# Patient Record
Sex: Female | Born: 1980 | Hispanic: No | Marital: Single | Smoking: Never smoker
Health system: Southern US, Community
[De-identification: ages and names within clinical notes are randomized; demographics above are authoritative.]

## PROBLEM LIST (undated history)

## (undated) ENCOUNTER — Inpatient Hospital Stay (HOSPITAL_COMMUNITY): Payer: Self-pay

## (undated) DIAGNOSIS — Z789 Other specified health status: Secondary | ICD-10-CM

## (undated) HISTORY — PX: NO PAST SURGERIES: SHX2092

---

## 2016-06-10 LAB — OB RESULTS CONSOLE ABO/RH: RH TYPE: POSITIVE

## 2016-06-10 LAB — OB RESULTS CONSOLE GC/CHLAMYDIA
Chlamydia: NEGATIVE
GC PROBE AMP, GENITAL: NEGATIVE

## 2016-06-10 LAB — OB RESULTS CONSOLE ANTIBODY SCREEN: ANTIBODY SCREEN: NEGATIVE

## 2016-06-10 LAB — OB RESULTS CONSOLE RUBELLA ANTIBODY, IGM: Rubella: IMMUNE

## 2016-06-10 LAB — OB RESULTS CONSOLE HEPATITIS B SURFACE ANTIGEN: Hepatitis B Surface Ag: NEGATIVE

## 2016-06-10 LAB — OB RESULTS CONSOLE HIV ANTIBODY (ROUTINE TESTING): HIV: NONREACTIVE

## 2016-06-10 LAB — OB RESULTS CONSOLE RPR: RPR: NONREACTIVE

## 2016-07-04 ENCOUNTER — Encounter (HOSPITAL_COMMUNITY): Payer: Self-pay

## 2016-07-04 ENCOUNTER — Inpatient Hospital Stay (HOSPITAL_COMMUNITY)
Admission: AD | Admit: 2016-07-04 | Discharge: 2016-07-04 | Disposition: A | Discharge status: Home / Self Care | Payer: 59 | Source: Ambulatory Visit | Attending: Obstetrics and Gynecology | Admitting: Obstetrics and Gynecology

## 2016-07-04 ENCOUNTER — Inpatient Hospital Stay (HOSPITAL_COMMUNITY): Payer: 59

## 2016-07-04 DIAGNOSIS — O4690 Antepartum hemorrhage, unspecified, unspecified trimester: Secondary | ICD-10-CM

## 2016-07-04 DIAGNOSIS — O418X20 Other specified disorders of amniotic fluid and membranes, second trimester, not applicable or unspecified: Secondary | ICD-10-CM

## 2016-07-04 DIAGNOSIS — O26851 Spotting complicating pregnancy, first trimester: Secondary | ICD-10-CM | POA: Insufficient documentation

## 2016-07-04 DIAGNOSIS — Z3A13 13 weeks gestation of pregnancy: Secondary | ICD-10-CM | POA: Insufficient documentation

## 2016-07-04 DIAGNOSIS — O26891 Other specified pregnancy related conditions, first trimester: Secondary | ICD-10-CM | POA: Diagnosis not present

## 2016-07-04 DIAGNOSIS — O468X2 Other antepartum hemorrhage, second trimester: Secondary | ICD-10-CM

## 2016-07-04 DIAGNOSIS — Z6790 Unspecified blood type, Rh positive: Secondary | ICD-10-CM

## 2016-07-04 HISTORY — DX: Other specified health status: Z78.9

## 2016-07-04 LAB — CBC
HCT: 35 % — ABNORMAL LOW (ref 36.0–46.0)
Hemoglobin: 12.2 g/dL (ref 12.0–15.0)
MCH: 31.6 pg (ref 26.0–34.0)
MCHC: 34.9 g/dL (ref 30.0–36.0)
MCV: 90.7 fL (ref 78.0–100.0)
PLATELETS: 288 10*3/uL (ref 150–400)
RBC: 3.86 MIL/uL — AB (ref 3.87–5.11)
RDW: 13.1 % (ref 11.5–15.5)
WBC: 8.4 10*3/uL (ref 4.0–10.5)

## 2016-07-04 LAB — URINALYSIS, ROUTINE W REFLEX MICROSCOPIC
Bilirubin Urine: NEGATIVE
Glucose, UA: NEGATIVE mg/dL
KETONES UR: NEGATIVE mg/dL
LEUKOCYTES UA: NEGATIVE
NITRITE: NEGATIVE
PH: 6 (ref 5.0–8.0)
Protein, ur: NEGATIVE mg/dL
Specific Gravity, Urine: 1.01 (ref 1.005–1.030)

## 2016-07-04 LAB — URINE MICROSCOPIC-ADD ON

## 2016-07-04 LAB — ABO/RH: ABO/RH(D): O POS

## 2016-07-04 NOTE — MAU Provider Note (Signed)
History     CSN: [id number]  Arrival date and time: 07/04/16 [id number]   First Provider Initiated Contact with Patient 07/04/16 0401      Chief Complaint  Patient presents with  . Vaginal Bleeding   G2P1001 @13 .3 weeks here with VB. She reports pink and brown spotting since yesterday then became bright red about 3 hrs ago. She saw the blood on the toilet paper and some in toilet. She had some lower abdominal cramping yesterday which subsided but is having lower abdominal pressure. She denies any recent IC. No traumatic injury or falls.   OB History    Gravida Para Term Preterm AB Living   2 1 1     1    SAB TAB Ectopic Multiple Live Births           1      Past Medical History:  Diagnosis Date  . Medical history non-contributory     Past Surgical History:  Procedure Laterality Date  . NO PAST SURGERIES      Family History  Problem Relation Age of Onset  . Hypertension Father   . Hyperlipidemia Father     Social History  Substance Use Topics  . Smoking status: Never Smoker  . Smokeless tobacco: Never Used  . Alcohol use No    Allergies: No Known Allergies  Prescriptions Prior to Admission  Medication Sig Dispense Refill Last Dose  . folic acid (FOLVITE) 400 MCG tablet Take 400 mcg by mouth daily.   07/03/2016 at Unknown time  . Prenatal Vit-Fe Fumarate-FA (PRENATAL MULTIVITAMIN) TABS tablet Take 1 tablet by mouth daily at 12 noon.   07/03/2016 at Unknown time    Review of Systems  Constitutional: Negative.   Gastrointestinal: Negative.    Physical Exam   Blood pressure 122/80, pulse 79, temperature 98.2 F (36.8 C), temperature source Oral, resp. rate 16, weight 70.9 kg (156 lb 6 oz), last menstrual period 04/02/2016, SpO2 100 %.  Physical Exam  Constitutional: She is oriented to person, place, and time. She appears well-developed and well-nourished. No distress.  HENT:  Head: Normocephalic and atraumatic.  Neck: Normal range of motion.  Cardiovascular:  Normal rate.   Respiratory: Effort normal.  GI: Soft. She exhibits no distension and no mass. There is no tenderness. There is no rebound and no guarding.  Genitourinary:  Genitourinary Comments: External: no lesions Vagina: rugated, parous, scant brown discharge SVE: closed/long   Musculoskeletal: Normal range of motion.  Neurological: She is alert and oriented to person, place, and time.  Skin: Skin is warm and dry.  Psychiatric: She has a normal mood and affect.   FHT: 170 bpm  Results for orders placed or performed during the hospital encounter of 07/04/16 (from the past 24 hour(s))  Urinalysis, Routine w reflex microscopic ([facility])     Status: Abnormal   Collection Time: 07/04/16  3:20 AM  Result Value Ref Range   Color, Urine YELLOW YELLOW   APPearance CLEAR CLEAR   Specific Gravity, Urine 1.010 1.005 - 1.030   pH 6.0 5.0 - 8.0   Glucose, UA NEGATIVE NEGATIVE mg/dL   Hgb urine dipstick TRACE (A) NEGATIVE   Bilirubin Urine NEGATIVE NEGATIVE   Ketones, ur NEGATIVE NEGATIVE mg/dL   Protein, ur NEGATIVE NEGATIVE mg/dL   Nitrite NEGATIVE NEGATIVE   Leukocytes, UA NEGATIVE NEGATIVE  Urine microscopic-add on     Status: Abnormal   Collection Time: 07/04/16  3:20 AM  Result Value Ref Range  Squamous Epithelial / LPF 0-5 (A) NONE SEEN   WBC, UA 0-5 0 - 5 WBC/hpf   RBC / HPF 0-5 0 - 5 RBC/hpf   Bacteria, UA RARE (A) NONE SEEN  ABO/Rh     Status: None   Collection Time: 07/04/16  4:08 AM  Result Value Ref Range   ABO/RH(D) O POS   CBC     Status: Abnormal   Collection Time: 07/04/16  4:08 AM  Result Value Ref Range   WBC 8.4 4.0 - 10.5 K/uL   RBC 3.86 (L) 3.87 - 5.11 MIL/uL   Hemoglobin 12.2 12.0 - 15.0 g/dL   HCT 16.135.0 (L) 09.636.0 - 04.546.0 %   MCV 90.7 78.0 - 100.0 fL   MCH 31.6 26.0 - 34.0 pg   MCHC 34.9 30.0 - 36.0 g/dL   RDW 40.913.1 81.111.5 - 91.415.5 %   Platelets 288 150 - 400 K/uL   [place] Ob Comp Less 14 Wks  Result Date: 07/04/2016 CLINICAL DATA:  Spotting and cramping  today EXAM: OBSTETRIC <14 WK ULTRASOUND TECHNIQUE: Transabdominal ultrasound was performed for evaluation of the gestation as well as the maternal uterus and adnexal regions. COMPARISON:  None. FINDINGS: Intrauterine gestational sac: Single Yolk sac:  Not visible Embryo:  Visible Cardiac Activity: Visible Heart Rate: 177 bpm MSD:   mm    w     d CRL:   68.6  mm   13 w 1 d                  US EDC: 01/08/2017 Subchorionic hemorrhage: Subchorionic hemorrhage, measuring 1.2 x 2.6 x 4.0 cm. Maternal uterus/adnexae: Neither ovary is visible. No abnormal pelvic fluid collections. IMPRESSION: Single living intrauterine gestation measuring 13 weeks 1 day by crown-rump length. Subchorionic hemorrhage noted. Electronically Signed   By: Ellery Plunkaniel R Mitchell M.D.   On: 07/04/2016 04:45    MAU Course  Procedures  MDM Labs and US ordered and reviewed. [facility]. Discussed presentation, clinical findings, and plan with Dr. Billy Coastaavon. Stable for discharge home.  Assessment and Plan   1. Subchorionic hematoma in second trimester, single or unspecified fetus   2. Vaginal bleeding in pregnancy   3.      Rh positive  Discharge home Follow up in office as scheduled Bleeding precautions Continue PNV 1 po daily   Donette LarryMelanie Josten [place], CNM 07/04/2016, 4:09 AM

## 2016-07-04 NOTE — Discharge Instructions (Signed)
Subchorionic Hematoma °A subchorionic hematoma is a gathering of blood between the outer wall of the placenta and the inner wall of the womb (uterus). The placenta is the organ that connects the fetus to the wall of the uterus. The placenta performs the feeding, breathing (oxygen to the fetus), and waste removal (excretory work) of the fetus.  °Subchorionic hematoma is the most common abnormality found on a result from ultrasonography done during the first trimester or early second trimester of pregnancy. If there has been little or no vaginal bleeding, early small hematomas usually shrink on their own and do not affect your baby or pregnancy. The blood is gradually absorbed over 1-2 weeks. When bleeding starts later in pregnancy or the hematoma is larger or occurs in an older pregnant woman, the outcome may not be as good. Larger hematomas may get bigger, which increases the chances for miscarriage. Subchorionic hematoma also increases the risk of premature detachment of the placenta from the uterus, preterm (premature) labor, and stillbirth. °HOME CARE INSTRUCTIONS °· Stay on bed rest if your health care provider recommends this. Although bed rest will not prevent more bleeding or prevent a miscarriage, your health care provider may recommend bed rest until you are advised otherwise. °· Avoid heavy lifting (more than 10 lb [4.5 kg]), exercise, sexual intercourse, or douching as directed by your health care provider. °· Keep track of the number of pads you use each day and how soaked (saturated) they are. Write down this information. °· Do not use tampons. °· Keep all follow-up appointments as directed by your health care provider. Your health care provider may ask you to have follow-up blood tests or ultrasound tests or both. °SEEK IMMEDIATE MEDICAL CARE IF: °· You have severe cramps in your stomach, back, abdomen, or pelvis. °· You have a fever. °· You pass large clots or tissue. Save any tissue for your health  care provider to look at. °· Your bleeding increases or you become lightheaded, feel weak, or have fainting episodes. °This information is not intended to replace advice given to you by your health care provider. Make sure you discuss any questions you have with your health care provider. °Document Released: 11/18/2006 Document Revised: 08/24/2014 Document Reviewed: 03/02/2013 °Elsevier Interactive Patient Education © 2017 [employer] Inc[employer] ° °

## 2016-07-04 NOTE — MAU Note (Signed)
Brown spotting to pink spotting on Friday at 2:30 pm.  At 1:30 am, bright red when wiped. Pressure at low abd now, was cramping yesterday but not now.

## 2016-08-17 NOTE — L&D Delivery Note (Addendum)
Delivery Note  First Stage: Labor onset: 5/29 @ 0415 with SROM Augmentation : none Analgesia /Anesthesia intrapartum: none SROM at 0410 am for light meconium/yellow fluid   Second Stage: Complete dilation at 0740 Onset of pushing at 0730 am FHR second stage: intermittent monitoring FHR 130s before, during, and after contraction  In the birthing tub at 0721am Water temp: 101.1 degrees F, but cooled down to 100.30F Total time in the tub: 30 minutes  Delivery of a viable female at 70751am by Carlean JewsMeredith Sigmon, CNM in LOA position Double nuchal cord - first loop slipped down over the body, and then somersaulted the baby to relieve the second tight nuchal cord - T. Bailey to assist  Cord double clamped after cessation of pulsation, cut by FOB Cord blood sample collected   Third Stage: Placenta delivered via Tomasa BlaseSchultz intact with 3 VC @ 0800 Placenta disposition: hospital disposal  Uterine tone firm with massage and IV Pitocin 500mL bolus infusing / bleeding moderate  Bilateral sulcus laceration identified - repaired by Wiliam Ke. Bailey, CNM 1st degree perineal: repaired by M. Sigmon, CNM  Anesthesia for repair: 1% Lidocaine 30mL Repair: 2.0 vicryl and 3.0 vicryl Est. Blood Loss (mL): 300mL  Complications: none  Mom to postpartum.  Baby to Couplet care / Skin to Skin.  Newborn: Birth Weight: 7#11.3oz (3496grams) Apgar Scores: 9, 9 Feeding planned: breast  Carlean JewsMeredith Sigmon, MSN, CNM [facility]

## 2016-12-04 LAB — OB RESULTS CONSOLE GBS: STREP GROUP B AG: NEGATIVE

## 2017-01-12 ENCOUNTER — Inpatient Hospital Stay (HOSPITAL_COMMUNITY): Payer: 59 | Admitting: Anesthesiology

## 2017-01-12 ENCOUNTER — Inpatient Hospital Stay (HOSPITAL_COMMUNITY)
Admission: AD | Admit: 2017-01-12 | Discharge: 2017-01-14 | DRG: 767 | Disposition: A | Discharge status: Home / Self Care | Payer: 59 | Source: Ambulatory Visit | Attending: Obstetrics and Gynecology | Admitting: Obstetrics and Gynecology

## 2017-01-12 ENCOUNTER — Encounter (HOSPITAL_COMMUNITY): Payer: Self-pay

## 2017-01-12 ENCOUNTER — Encounter (HOSPITAL_COMMUNITY)
Admission: AD | Disposition: A | Discharge status: Home / Self Care | Payer: Self-pay | Source: Ambulatory Visit | Attending: Obstetrics and Gynecology

## 2017-01-12 DIAGNOSIS — Z3A40 40 weeks gestation of pregnancy: Secondary | ICD-10-CM | POA: Diagnosis not present

## 2017-01-12 DIAGNOSIS — Z3493 Encounter for supervision of normal pregnancy, unspecified, third trimester: Secondary | ICD-10-CM | POA: Diagnosis present

## 2017-01-12 DIAGNOSIS — Z302 Encounter for sterilization: Secondary | ICD-10-CM

## 2017-01-12 HISTORY — PX: TUBAL LIGATION: SHX77

## 2017-01-12 LAB — CBC
HEMATOCRIT: 39.1 % (ref 36.0–46.0)
HEMOGLOBIN: 13.3 g/dL (ref 12.0–15.0)
MCH: 31.3 pg (ref 26.0–34.0)
MCHC: 34 g/dL (ref 30.0–36.0)
MCV: 92 fL (ref 78.0–100.0)
Platelets: 247 10*3/uL (ref 150–400)
RBC: 4.25 MIL/uL (ref 3.87–5.11)
RDW: 14 % (ref 11.5–15.5)
WBC: 9.4 10*3/uL (ref 4.0–10.5)

## 2017-01-12 LAB — TYPE AND SCREEN
ABO/RH(D): O POS
ANTIBODY SCREEN: NEGATIVE

## 2017-01-12 LAB — RPR: RPR: NONREACTIVE

## 2017-01-12 SURGERY — LIGATION, FALLOPIAN TUBE, POSTPARTUM
Anesthesia: General

## 2017-01-12 MED ORDER — DIPHENHYDRAMINE HCL 25 MG PO CAPS: 25.0000 mg | ORAL_CAPSULE | Freq: Four times a day (QID) | ORAL | Status: DC | PRN

## 2017-01-12 MED ORDER — OXYCODONE HCL 5 MG/5ML PO SOLN: 5.0000 mg | Freq: Once | ORAL | Status: AC | PRN

## 2017-01-12 MED ORDER — LACTATED RINGERS IV SOLN
INTRAVENOUS | Status: DC
  Administered 2017-01-12: 16:00:00 via INTRAVENOUS
  Administered 2017-01-12: 20 mL/h via INTRAVENOUS

## 2017-01-12 MED ORDER — OXYCODONE HCL 5 MG PO TABS
ORAL_TABLET | ORAL | Status: AC
  Filled 2017-01-12: qty 1

## 2017-01-12 MED ORDER — PROMETHAZINE HCL 25 MG/ML IJ SOLN: 6.2500 mg | INTRAMUSCULAR | Status: DC | PRN

## 2017-01-12 MED ORDER — WITCH HAZEL-GLYCERIN EX PADS: 1.0000 "application " | MEDICATED_PAD | CUTANEOUS | Status: DC | PRN

## 2017-01-12 MED ORDER — OXYTOCIN 40 UNITS IN LACTATED RINGERS INFUSION - SIMPLE MED
2.5000 [IU]/h | INTRAVENOUS | Status: DC
  Filled 2017-01-12: qty 1000

## 2017-01-12 MED ORDER — LIDOCAINE HCL (PF) 1 % IJ SOLN
30.0000 mL | INTRAMUSCULAR | Status: AC | PRN
  Administered 2017-01-12: 30 mL via SUBCUTANEOUS
  Filled 2017-01-12: qty 30

## 2017-01-12 MED ORDER — OXYCODONE HCL 5 MG PO TABS
5.0000 mg | ORAL_TABLET | Freq: Once | ORAL | Status: AC | PRN
  Administered 2017-01-12: 5 mg via ORAL

## 2017-01-12 MED ORDER — OXYCODONE HCL 5 MG PO TABS: 10.0000 mg | ORAL_TABLET | ORAL | Status: DC | PRN

## 2017-01-12 MED ORDER — ONDANSETRON HCL 4 MG/2ML IJ SOLN
INTRAMUSCULAR | Status: DC | PRN
  Administered 2017-01-12: 4 mg via INTRAVENOUS

## 2017-01-12 MED ORDER — SENNOSIDES-DOCUSATE SODIUM 8.6-50 MG PO TABS
2.0000 | ORAL_TABLET | ORAL | Status: DC
  Administered 2017-01-12 – 2017-01-14 (×2): 2 via ORAL
  Filled 2017-01-12 (×2): qty 2

## 2017-01-12 MED ORDER — ONDANSETRON HCL 4 MG/2ML IJ SOLN: 4.0000 mg | INTRAMUSCULAR | Status: DC | PRN

## 2017-01-12 MED ORDER — FAMOTIDINE 20 MG PO TABS
40.0000 mg | ORAL_TABLET | Freq: Once | ORAL | Status: AC
  Administered 2017-01-12: 40 mg via ORAL
  Filled 2017-01-12: qty 2

## 2017-01-12 MED ORDER — IBUPROFEN 600 MG PO TABS
600.0000 mg | ORAL_TABLET | Freq: Four times a day (QID) | ORAL | Status: DC
  Administered 2017-01-12 – 2017-01-14 (×8): 600 mg via ORAL
  Filled 2017-01-12 (×9): qty 1

## 2017-01-12 MED ORDER — FENTANYL CITRATE (PF) 100 MCG/2ML IJ SOLN: 25.0000 ug | INTRAMUSCULAR | Status: DC | PRN

## 2017-01-12 MED ORDER — SIMETHICONE 80 MG PO CHEW: 80.0000 mg | CHEWABLE_TABLET | ORAL | Status: DC | PRN

## 2017-01-12 MED ORDER — OXYTOCIN BOLUS FROM INFUSION
500.0000 mL | Freq: Once | INTRAVENOUS | Status: AC
  Administered 2017-01-12: 500 mL via INTRAVENOUS

## 2017-01-12 MED ORDER — LACTATED RINGERS IV SOLN: INTRAVENOUS | Status: DC

## 2017-01-12 MED ORDER — ACETAMINOPHEN 325 MG PO TABS: 650.0000 mg | ORAL_TABLET | ORAL | Status: DC | PRN

## 2017-01-12 MED ORDER — BUPIVACAINE HCL (PF) 0.25 % IJ SOLN
INTRAMUSCULAR | Status: DC | PRN
  Administered 2017-01-12: 30 mL

## 2017-01-12 MED ORDER — DIBUCAINE 1 % RE OINT: 1.0000 "application " | TOPICAL_OINTMENT | RECTAL | Status: DC | PRN

## 2017-01-12 MED ORDER — ONDANSETRON HCL 4 MG/2ML IJ SOLN: 4.0000 mg | Freq: Four times a day (QID) | INTRAMUSCULAR | Status: DC | PRN

## 2017-01-12 MED ORDER — METOCLOPRAMIDE HCL 10 MG PO TABS
10.0000 mg | ORAL_TABLET | Freq: Once | ORAL | Status: AC
  Administered 2017-01-12: 10 mg via ORAL
  Filled 2017-01-12: qty 1

## 2017-01-12 MED ORDER — LACTATED RINGERS IV SOLN: 500.0000 mL | INTRAVENOUS | Status: DC | PRN

## 2017-01-12 MED ORDER — COCONUT OIL OIL
1.0000 "application " | TOPICAL_OIL | Status: DC | PRN
  Administered 2017-01-13: 1 via TOPICAL
  Filled 2017-01-12: qty 120

## 2017-01-12 MED ORDER — PHENYLEPHRINE HCL 10 MG/ML IJ SOLN
INTRAMUSCULAR | Status: DC | PRN
  Administered 2017-01-12: 80 ug via INTRAVENOUS
  Administered 2017-01-12 (×2): 40 ug via INTRAVENOUS

## 2017-01-12 MED ORDER — OXYCODONE HCL 5 MG PO TABS
5.0000 mg | ORAL_TABLET | ORAL | Status: DC | PRN
Start: 2017-01-12 — End: 2017-01-14
  Administered 2017-01-12 – 2017-01-13 (×4): 5 mg via ORAL
  Filled 2017-01-12 (×4): qty 1

## 2017-01-12 MED ORDER — ONDANSETRON HCL 4 MG PO TABS: 4.0000 mg | ORAL_TABLET | ORAL | Status: DC | PRN

## 2017-01-12 MED ORDER — KETOROLAC TROMETHAMINE 30 MG/ML IJ SOLN
INTRAMUSCULAR | Status: AC
  Filled 2017-01-12: qty 1

## 2017-01-12 MED ORDER — ZOLPIDEM TARTRATE 5 MG PO TABS: 5.0000 mg | ORAL_TABLET | Freq: Every evening | ORAL | Status: DC | PRN

## 2017-01-12 MED ORDER — ACETAMINOPHEN 325 MG PO TABS
650.0000 mg | ORAL_TABLET | ORAL | Status: DC | PRN
  Administered 2017-01-12 – 2017-01-13 (×2): 650 mg via ORAL
  Filled 2017-01-12 (×2): qty 2

## 2017-01-12 MED ORDER — KETOROLAC TROMETHAMINE 30 MG/ML IJ SOLN
30.0000 mg | Freq: Once | INTRAMUSCULAR | Status: DC | PRN
  Administered 2017-01-12: 30 mg via INTRAVENOUS

## 2017-01-12 MED ORDER — SOD CITRATE-CITRIC ACID 500-334 MG/5ML PO SOLN: 30.0000 mL | ORAL | Status: DC | PRN

## 2017-01-12 MED ORDER — MEPERIDINE HCL 25 MG/ML IJ SOLN: 6.2500 mg | INTRAMUSCULAR | Status: DC | PRN

## 2017-01-12 MED ORDER — MIDAZOLAM HCL 2 MG/2ML IJ SOLN
INTRAMUSCULAR | Status: DC | PRN
  Administered 2017-01-12: 2 mg via INTRAVENOUS

## 2017-01-12 MED ORDER — FENTANYL CITRATE (PF) 100 MCG/2ML IJ SOLN
INTRAMUSCULAR | Status: DC | PRN
  Administered 2017-01-12: 50 ug via INTRAVENOUS

## 2017-01-12 MED ORDER — MEPERIDINE HCL 25 MG/ML IJ SOLN
INTRAMUSCULAR | Status: DC | PRN
Start: 2017-01-12 — End: 2017-01-12
  Administered 2017-01-12: 12.5 mg via INTRAVENOUS

## 2017-01-12 MED ORDER — BENZOCAINE-MENTHOL 20-0.5 % EX AERO
1.0000 "application " | INHALATION_SPRAY | CUTANEOUS | Status: DC | PRN
  Administered 2017-01-12: 1 via TOPICAL
  Filled 2017-01-12: qty 56

## 2017-01-12 MED ORDER — PRENATAL MULTIVITAMIN CH
1.0000 | ORAL_TABLET | Freq: Every day | ORAL | Status: DC
  Administered 2017-01-13 – 2017-01-14 (×2): 1 via ORAL
  Filled 2017-01-12 (×2): qty 1

## 2017-01-12 SURGICAL SUPPLY — 29 items
CLOTH BEACON ORANGE TIMEOUT ST (SAFETY) ×2 IMPLANT
CONTAINER PREFILL 10% NBF 15ML (MISCELLANEOUS) ×4 IMPLANT
DECANTER SPIKE VIAL GLASS SM (MISCELLANEOUS) ×2 IMPLANT
DERMABOND ADVANCED (GAUZE/BANDAGES/DRESSINGS) ×1
DERMABOND ADVANCED .7 DNX12 (GAUZE/BANDAGES/DRESSINGS) ×1 IMPLANT
DRESSING OPSITE X SMALL 2X3 (GAUZE/BANDAGES/DRESSINGS) ×2 IMPLANT
DRSG OPSITE POSTOP 3X4 (GAUZE/BANDAGES/DRESSINGS) ×2 IMPLANT
DURAPREP 26ML APPLICATOR (WOUND CARE) ×2 IMPLANT
ELECT REM PT RETURN 9FT ADLT (ELECTROSURGICAL) ×2
ELECTRODE REM PT RTRN 9FT ADLT (ELECTROSURGICAL) ×1 IMPLANT
GLOVE BIO SURGEON STRL SZ7 (GLOVE) ×2 IMPLANT
GLOVE BIOGEL PI IND STRL 7.0 (GLOVE) ×2 IMPLANT
GLOVE BIOGEL PI INDICATOR 7.0 (GLOVE) ×2
GOWN STRL REUS W/TWL LRG LVL3 (GOWN DISPOSABLE) ×4 IMPLANT
NEEDLE HYPO 22GX1.5 SAFETY (NEEDLE) ×2 IMPLANT
NS IRRIG 1000ML POUR BTL (IV SOLUTION) ×2 IMPLANT
PACK ABDOMINAL MINOR (CUSTOM PROCEDURE TRAY) ×2 IMPLANT
PENCIL BUTTON HOLSTER BLD 10FT (ELECTRODE) ×2 IMPLANT
PROTECTOR NERVE ULNAR (MISCELLANEOUS) ×2 IMPLANT
SPONGE LAP 4X18 X RAY DECT (DISPOSABLE) IMPLANT
STRIP CLOSURE SKIN 1/4X3 (GAUZE/BANDAGES/DRESSINGS) ×2 IMPLANT
SUT PLAIN 0 NONE (SUTURE) ×2 IMPLANT
SUT VIC AB 0 CT1 27 (SUTURE) ×1
SUT VIC AB 0 CT1 27XBRD ANBCTR (SUTURE) ×1 IMPLANT
SUT VICRYL 4-0 PS2 18IN ABS (SUTURE) ×2 IMPLANT
SYR CONTROL 10ML LL (SYRINGE) ×2 IMPLANT
TOWEL OR 17X24 6PK STRL BLUE (TOWEL DISPOSABLE) ×4 IMPLANT
TRAY FOLEY CATH SILVER 14FR (SET/KITS/TRAYS/PACK) ×2 IMPLANT
WATER STERILE IRR 1000ML POUR (IV SOLUTION) ×2 IMPLANT

## 2017-01-12 NOTE — Anesthesia Postprocedure Evaluation (Signed)
Anesthesia Post Note  Patient: Christina Leon  Procedure(s) Performed: Procedure(s) (LRB): POST PARTUM TUBAL LIGATION (N/A)  Patient location during evaluation: PACU Anesthesia Type: General Level of consciousness: sedated Pain management: pain level controlled Vital Signs Assessment: post-procedure vital signs reviewed and stable Respiratory status: spontaneous breathing and respiratory function stable Cardiovascular status: stable Anesthetic complications: no        Last Vitals:  Vitals:   01/12/17 1815 01/12/17 1830  BP: 126/85 113/73  Pulse: 78 73  Resp: 19 14  Temp:      Last Pain:  Vitals:   01/12/17 1800  TempSrc:   PainSc: 2    Pain Goal: Patients Stated Pain Goal: 3 (01/12/17 1715)               Halyn Flaugher DANIEL

## 2017-01-12 NOTE — Progress Notes (Signed)
S:  PT. In tub with water temp 101F - water cooled to 100.8      In hands/knees position      Involuntary pushing   O:  VS: Blood pressure 123/65, pulse 84, temperature 97.4 F (36.3 C), temperature source Oral, resp. rate 18, height 5\' 7"  (1.702 m), weight 81.2 kg (179 lb), last menstrual period 04/02/2016, unknown if currently breastfeeding.        FHR : Doppler: 150s        Toco: contractions every 1-643minutes / strong         Cervix : 9.5cm/100/+1 vtx        Membranes:   A: Active labor  P: Okay to enter tub     Thressa ShellerHeather Hogan, CNM with [facility] on standby until Wiliam Ke. Bailey, CNM arrives     Anticipate NSVD soon  Carlean JewsMeredith Sigmon, CNM

## 2017-01-12 NOTE — Progress Notes (Signed)
S:  Pt. In shower on hands/knees while birthing tub is filling       Breathing well through contractions, sounding transitional   O:  VS: Blood pressure 123/65, pulse 84, temperature 97.4 F (36.3 C), temperature source Oral, resp. rate 18, height 5\' 7"  (1.702 m), weight 81.2 kg (179 lb), last menstrual period 04/02/2016, unknown if currently breastfeeding.        FHR : baseline 150 bpm / variability moderate / accelerations + / no decelerations        Toco: contractions every 1-3 minutes / strong /        Cervix : deferred        Membranes: SROM- bloody show present   A: Active labor     FHR category: NST was Category 1     Intermittent monitoring  P: May enter tub once filled and water temperature stable      Ok for intermittent monitoring      Wiliam Ke. Bailey, CNM in route for delivery  Christina Leon, CNM

## 2017-01-12 NOTE — Progress Notes (Signed)
Patient ID: Christina Leon, female   DOB: [birth year], 36 y.o.   MRN: [id number] 36 yo G2P2, NSVD this morning. Desires permanent sterilization. Healthy female, uncomplicated pregnancy, no Med/Surg Hx. NKDAs.  PE-VS wnl, exam wnl. No postpartum concerns.   Risks/complications of surgery reviewed incl infection, bleeding, damage to internal organs including bladder, bowels, ureters, blood vessels, other risks from anesthesia, VTE and delayed complications of any surgery, complications in future surgery reviewed. Also reviewed risk of failure/ ectopic pregnancy/ risk of regret. Pt understands and agrees.   Hilary Hertz-V.Marshelle Bilger, MD

## 2017-01-12 NOTE — Anesthesia Procedure Notes (Signed)
Spinal

## 2017-01-12 NOTE — Transfer of Care (Signed)
Immediate Anesthesia Transfer of Care Note  Patient: Christina Leon  Procedure(s) Performed: Procedure(s): POST PARTUM TUBAL LIGATION (N/A)  Patient Location: PACU  Anesthesia Type:Spinal  Level of Consciousness: awake, alert  and oriented  Airway & Oxygen Therapy: Patient Spontanous Breathing  Post-op Assessment: Report given to RN and Post -op Vital signs reviewed and stable  Post vital signs: Reviewed and stable  Last Vitals:  Vitals:   01/12/17 1144 01/12/17 1448  BP: 125/72 134/76  Pulse: 84 86  Resp: 18 20  Temp: 37.1 C 37.6 C    Last Pain:  Vitals:   01/12/17 1448  TempSrc: Axillary  PainSc:       Patients Stated Pain Goal: 3 (01/12/17 1430)  Complications: No apparent anesthesia complications

## 2017-01-12 NOTE — MAU Note (Signed)
contractions 

## 2017-01-12 NOTE — H&P (Signed)
  OB ADMISSION/ HISTORY & PHYSICAL:  Admission Date: 01/12/2017  5:57 AM  Admit Diagnosis: Active labor at term   Christina Leon is a 36 y.o. female G2P1 at 40+6 weeks presenting for SROM at 0415, yellow fluid, and strong, regular uterine contractions.   Prenatal History: G2P1001   EDC : 01/06/2017, Date entered prior to episode creation  [facility] 1st [facility] Primary Ob Provider: Marlinda Mikeanya Bailey, CNM  Prenatal course complicated by: - Hx. Of forceps delivery at 37 weeks (delivery was in [place] and pt. Unsure of reason)  Prenatal Labs: ABO, Rh: --/--/O POS (11/18 0408) Antibody:  Negative  Rubella:   Immune  RPR:   NR HBsAg:   Negative HIV:   NR GTT: 120 GBS:   Negative  Medical / Surgical History :  Past medical history:  Past Medical History:  Diagnosis Date  . Medical history non-contributory      Past surgical history:  Past Surgical History:  Procedure Laterality Date  . NO PAST SURGERIES      Family History:  Family History  Problem Relation Age of Onset  . Hypertension Father   . Hyperlipidemia Father      Social History:  reports that she has never smoked. She has never used smokeless tobacco. She reports that she does not drink alcohol or use drugs.   Allergies: Patient has no known allergies.    Current Medications at time of admission:  Prior to Admission medications   Medication Sig Start Date End Date Taking? Authorizing Provider  folic acid (FOLVITE) 400 MCG tablet Take 400 mcg by mouth daily.    [provider]  Prenatal Vit-Fe Fumarate-FA (PRENATAL MULTIVITAMIN) TABS tablet Take 1 tablet by mouth daily at 12 noon.    [provider]    Review of Systems: Active FM onset of ctx @ [phone] currently every 1.5-.3 minutes LOF  / SROM @ 0415- yellow/light meconium stained fluid  bloody show not present  Physical Exam:  VS: Last menstrual period 04/02/2016.  General: alert and oriented,  breathing through ctxs well  Heart: RRR Lungs: Clear lung fields Abdomen: Gravid, soft and non-tender, non-distended / uterus:  Extremities: no  edema  Genitalia / VE: Dilation: 5.5 Effacement (%): 90 Station: 0 Exam by:: Meredith Sigmon CNM  FHR: baseline rate 150  / variability moderate / accelerations + / no decelerations TOCO: every 1.5-2 minutes/strong   Assessment: 40+[redacted] weeks gestation Active stage of labor FHR category 1 GBS Negative   Plan:  [facility]    - PIV for Postpartum BTL     - Routine labor and delivery orders and labs     - Plans for Water birth: doula is here and is Nature conservation officertub manager    - Consent for Water birth signed and policy reviewed     - T. Fredric MareBailey, CNM to attend water birth with me     - Intermittent fetal monitoring  2. GBS Negative     - No prophylaxis indicated 3. Postpartum:     - Plan PP BTL today     - Breast feeding 4. Anticipate NSVD     - Hx of forceps delivery at 37 weeks unknown reason     - Proven Pelvis: 7#1oz  Dr. Ernestina PennaFogleman notified of admission / plan of care  Carlean JewsMeredith Sigmon, MSN, [facility]

## 2017-01-12 NOTE — Op Note (Signed)
Christina LatusStephanie Krienke:  01/12/2017 Operative note:   Procedure: Postpartum bilateral tubal sterilization by left Pomeroy's partial salpingectomy and right complete salpingectomy  Preoperative diagnosis: Multiparity, permanent sterilization desired Postoperative diagnosis: Same  Surgeon: Dr Shea EvansVaishali Bubba Vanbenschoten, MD Assistants: Carlean JewsMeredith Sigmon, CNM Anesthesia: Spinal IV fluids LR 1000 cc  EBL minimal 5 cc Urine clear: in foley clear 100 cc  Complications none Disposition PACU  Specimens:  bilateral fallopian tubes - left tube segment, right complete tube   Procedure Patient is a healthy multiparous female who had uncomplicated vaginal delivery today, desires permanent sterilization by tubal ligation. She was counseled in office and counseled again on choices, also reviewed possible salpingectomy as data supports that, agreed.  Risk and complications of surgery including infection, bleeding, damage to internal organs, other complications including pneumonia, VTE were reviewed. Also discussed irreversibility as well as failure and risk of ectopic pregnancy. Patient voiced understanding.   Informed written consent was obtained and patient was brought to the operating room with IV running. Timeout was carried out. She underwent spinal anesthesia without difficulty and was given supine position, foley catheter placed. Examination under anesthesia revealed uterus at 1 cm below the umbilicus. She was prepped and draped in standard fashion.  A 10 mm transverse incision was made at the lower edge of the umbilicus after injecting 5 cc 0.25% Marcaine. Incision was carried down to the fascia was incised peritoneal entry was confirmed. Tail sponge used to pack bowel and slight trendelenburg given. Uterine fundus was palpated well. Left fallopian tube was grasped and carried out to the fimbria but felt tight, so Pomeroy's partial salpingectomy performed and large portion of the tube was removed after tying with 2-0 Plain  gut x 2 and good hemostasis noted after cutting the loop of the tube and tied stump returned to abdomen. Now her right tube was identified, fimbria grasped and pulled and 2 free ties of plain 2-0 gut placed and tube was cut. Stump noted hemostasis, returned to abdomen. Lap sponge removed. Hemostasis noted. Peritoneum and Fascia grasped together and sutured with 0-Vicryl. Skin closed with 4-0 Vicryl. Dermabond appplied. Portions of both the tubes sent to pathology.    All counts were correct x2. Patient was brought to the recovery room in stable condition. Will transfer to postpartum room from there. Patient tolerated surgery well.   I performed the surgery. Hilary Hertz-V.Benjie Ricketson, MD

## 2017-01-12 NOTE — Anesthesia Preprocedure Evaluation (Addendum)
Anesthesia Evaluation  Patient identified by MRN, date of birth, ID band Patient awake    Reviewed: Allergy & Precautions, NPO status , Patient's Chart, lab work & pertinent test results  Airway Mallampati: II  TM Distance: >3 FB Neck ROM: Full    Dental no notable dental hx.    Pulmonary neg pulmonary ROS,    Pulmonary exam normal breath sounds clear to auscultation       Cardiovascular negative cardio ROS Normal cardiovascular exam Rhythm:Regular Rate:Normal     Neuro/Psych negative neurological ROS  negative psych ROS   GI/Hepatic negative GI ROS, Neg liver ROS,   Endo/Other  negative endocrine ROS  Renal/GU negative Renal ROS     Musculoskeletal negative musculoskeletal ROS (+)   Abdominal   Peds  Hematology negative hematology ROS (+)   Anesthesia Other Findings   Reproductive/Obstetrics negative OB ROS                             Anesthesia Physical Anesthesia Plan  ASA: II  Anesthesia Plan: Spinal   Post-op Pain Management:    Induction:   Airway Management Planned:   Additional Equipment:   Intra-op Plan:   Post-operative Plan:   Informed Consent: I have reviewed the patients History and Physical, chart, labs and discussed the procedure including the risks, benefits and alternatives for the proposed anesthesia with the patient or authorized representative who has indicated his/her understanding and acceptance.   Dental advisory given  Plan Discussed with: CRNA  Anesthesia Plan Comments:         Anesthesia Quick Evaluation  

## 2017-01-13 ENCOUNTER — Encounter (HOSPITAL_COMMUNITY): Payer: Self-pay | Admitting: Obstetrics & Gynecology

## 2017-01-13 LAB — CBC
HCT: 32.7 % — ABNORMAL LOW (ref 36.0–46.0)
HEMOGLOBIN: 11.1 g/dL — AB (ref 12.0–15.0)
MCH: 31.4 pg (ref 26.0–34.0)
MCHC: 33.9 g/dL (ref 30.0–36.0)
MCV: 92.6 fL (ref 78.0–100.0)
Platelets: 253 10*3/uL (ref 150–400)
RBC: 3.53 MIL/uL — ABNORMAL LOW (ref 3.87–5.11)
RDW: 14.4 % (ref 11.5–15.5)
WBC: 11.3 10*3/uL — ABNORMAL HIGH (ref 4.0–10.5)

## 2017-01-13 MED ORDER — BUPIVACAINE IN DEXTROSE 0.75-8.25 % IT SOLN
INTRATHECAL | Status: DC | PRN
  Administered 2017-01-12: 1.5 mL via INTRATHECAL

## 2017-01-13 NOTE — Lactation Note (Signed)
This note was copied from a baby's chart. Lactation Consultation Note  Patient Name: Christina Marlana LatusStephanie Mcpeek ZOXWR'UToday's Date: 01/13/2017 Reason for consult: Follow-up assessment Baby at 30 hr of life. Mom was working with Massage therapy and requested that lactation come back. She briefly stated that bf was going well. She will call at the next feeding.   Maternal Data    Feeding    LATCH Score/Interventions                      Lactation Tools Discussed/Used     Consult Status Consult Status: Follow-up Date: 01/14/17 Follow-up type: In-patient    Rulon Eisenmengerlizabeth E Verania Salberg 01/13/2017, 2:45 PM

## 2017-01-13 NOTE — Progress Notes (Addendum)
PPD #1, SVD, water birth and s/p postpartum tubal sterilization   S:  Reports feeling sore and tired, planning to stay until tomorrow due to baby's bilirubin levels   +flatus   Denies CP, SOB             Tolerating po/ No nausea or vomiting             Bleeding is light             Pain controlled with Motrin, Tylenol, and Oxycodone             Up ad lib / ambulatory / voiding QS  Newborn breast feeding - going well  / Circumcision done today   O:               VS: BP 113/72 (BP Location: Left Arm)   Pulse 73   Temp 98.3 F (36.8 C) (Oral)   Resp 18   Ht 5\' 7"  (1.702 m)   Wt 81.2 kg (179 lb)   LMP 04/02/2016   SpO2 97%   Breastfeeding? Unknown   BMI 28.04 kg/m    LABS:              Recent Labs  01/12/17 0630 01/13/17 0529  WBC 9.4 11.3*  HGB 13.3 11.1*  PLT 247 253               Blood type: --/--/O POS (05/29 0630)  Rubella: Immune (10/25 0000)                     I&O: Intake/Output      05/29 0701 - 05/30 0700 05/30 0701 - 05/31 0700   I.V. (mL/kg) 1000 (12.3)    Total Intake(mL/kg) 1000 (12.3)    Urine (mL/kg/hr) 100 (0.1)    Blood 305 (0.2)    Total Output 405     Net +595                        Physical Exam:             Alert and oriented X3  Lungs: Clear and unlabored  Heart: regular rate and rhythm / no mumurs  Abdomen: soft,  Mild tenderness from BTL, non-distended, active bowel sounds  Incision: no erythema, no drainage, no ecchymosis              Fundus: firm, non-tender, U-1  Perineum: well approximated sulcus lacs and 1st degree laceration, healing well, mild edema noted, no significant erythema  Lochia: approprate  Extremities: no edema, no calf pain or tenderness    A: PPD # 1, SVD, water birth   S/p Postpartum bilateral tubal sterilization   Doing well - stable status  P: Routine post partum orders  See lactation today  Disability papers given to me for [facility] to fill out - advised pt. To f/u with the office   Anticipate discharge home  tomorrow   Carlean JewsMeredith Duel Conrad, MSN, CNM [facility]

## 2017-01-13 NOTE — Lactation Note (Signed)
This note was copied from a baby's chart. Lactation Consultation Note  Patient Name: Christina Leon NWGNF'AToday's Date: 01/13/2017 Reason for consult: Follow-up assessment;Breast/nipple pain  Lc called to room for flange fitting.  Mom is using hand pump on right breast due to flat non compressible areola on right breast.  Mom complains hand pump is hurting using #24 flange. LC located DEBP set up in personal belongings bag on back on DEBP from initial set up.  LC fit mom with #27 flange on right breast while baby is on left breast. Mom reports comfort with pumping and LC feels better fit.   MOm reports baby was gulping at the breast during beginning of feeding, baby is mostly non nutritive sucking at this time.  LC encouraged mom to keep baby active and compress breasts during feeding to help baby transfer well. Mom plans to post pump with right breast.  Mom has shells at bedside, but she is not using then yet.   MOm has coconut oil for right nipple soreness, LC encouraged mom to use as lubricant during pumping as needed.   Mom to call as needed.     Maternal Data Has patient been taught Hand Expression?: Yes  Feeding Feeding Type: Breast Fed Length of feed:  (few minutes of non nutritive sucking observed)  LATCH Score/Interventions Latch: Grasps breast easily, tongue down, lips flanged, rhythmical sucking.  Audible Swallowing: A few with stimulation  Type of Nipple: Everted at rest and after stimulation  Comfort (Breast/Nipple): Soft / non-tender     Hold (Positioning): Assistance needed to correctly position infant at breast and maintain latch. Intervention(s): Breastfeeding basics reviewed;Support Pillows  LATCH Score: 8  Lactation Tools Discussed/Used Initiated by:: reviewed   Consult Status Consult Status: Follow-up Date: 01/14/17 Follow-up type: In-patient    Jannifer RodneyShoptaw, Oanh Devivo Lynn 01/13/2017, 10:07 PM

## 2017-01-13 NOTE — Anesthesia Postprocedure Evaluation (Addendum)
Anesthesia Post Note  Patient: Lonzo CandyStephanie O Galbreath  Procedure(s) Performed: Procedure(s) (LRB): POST PARTUM TUBAL LIGATION (N/A)  Patient location during evaluation: Mother Baby Anesthesia Type: Spinal Level of consciousness: awake, awake and alert, oriented and patient cooperative Pain management: pain level controlled Vital Signs Assessment: post-procedure vital signs reviewed and stable Respiratory status: spontaneous breathing, nonlabored ventilation and respiratory function stable Cardiovascular status: stable Postop Assessment: no headache, no backache, patient able to bend at knees and no signs of nausea or vomiting Anesthetic complications: no        Last Vitals:  Vitals:   01/12/17 2354 01/13/17 0400  BP: 127/78 115/62  Pulse: 73 79  Resp: 18 18  Temp: 36.6 C 37.1 C    Last Pain:  Vitals:   01/13/17 0656  TempSrc:   PainSc: 3    Pain Goal: Patients Stated Pain Goal: 3 (01/12/17 1715)               CARVER,ALISON L

## 2017-01-13 NOTE — Anesthesia Procedure Notes (Signed)
Spinal  Patient location during procedure: OR Staffing Anesthesiologist: Nolon Nations Performed: anesthesiologist  Preanesthetic Checklist Completed: patient identified, site marked, surgical consent, pre-op evaluation, timeout performed, IV checked, risks and benefits discussed and monitors and equipment checked Spinal Block Patient position: sitting Prep: site prepped and draped and DuraPrep Patient monitoring: heart rate, continuous pulse ox and blood pressure Approach: midline Location: L3-4 Injection technique: single-shot Needle Needle type: Pencil-Tip  Needle gauge: 25 G Needle length: 9 cm Additional Notes Expiration date of kit checked and confirmed. Patient tolerated procedure well, without complications.

## 2017-01-13 NOTE — Lactation Note (Signed)
This note was copied from a baby's chart. Lactation Consultation Note Experienced BF mom BF her now 35 yr old for 13 months. Mom used a NS to her Rt. Nipple d/t flat. Lt. Nipple everted short shaft. Mom didn't use a NS to Lt. Breast.  Mom stated baby has BF well to Lt. Nipple. Noted bruising to Lt. Nipple, several "hickies" to Rt. Nipple and areola. Roll nipple in finger tips for stimulation, flattens back quickly. Noted edema to Lt. Areola. Reverse pressure helpful.  Fitted mom w/#24 nipple shield, to large, fitted #20 fit. #21 would be perfect. Mom denied painful latch. Encouraged to assess NS and nipple after feeding, breast before and after feeding for transfer.  Position in football position. Discussed props, body alignment, comfort, cluster feeding, I&O, STS, positions, supply and demand. Mom encouraged to feed baby 8-12 times/24 hours and with feeding cues.  Shells given to wear in bra. Mom shown how to use DEBP & how to disassemble, clean, & reassemble parts. WH/LC brochure given w/resources, support groups and LC services.  Patient Name: Christina Leon VHQIO'NToday's Date: 01/13/2017 Reason for consult: Initial assessment   Maternal Data Has patient been taught Hand Expression?: Yes Does the patient have breastfeeding experience prior to this delivery?: Yes  Feeding Feeding Type: Breast Fed Length of feed: 20 min (still BF)  LATCH Score/Interventions Latch: Grasps breast easily, tongue down, lips flanged, rhythmical sucking.  Audible Swallowing: A few with stimulation Intervention(s): Hand expression Intervention(s): Hand expression;Alternate breast massage  Type of Nipple: Everted at rest and after stimulation (Rt. nipple FLAT)  Comfort (Breast/Nipple): Filling, red/small blisters or bruises, mild/mod discomfort  Problem noted: Mild/Moderate discomfort;Cracked, bleeding, blisters, bruises Interventions  (Cracked/bleeding/bruising/blister): Expressed breast milk to nipple;Hand  pump;Reverse pressure Interventions (Mild/moderate discomfort): Hand massage;Hand expression;Pre-pump if needed;Post-pump;Breast shields  Hold (Positioning): Assistance needed to correctly position infant at breast and maintain latch. Intervention(s): Breastfeeding basics reviewed;Support Pillows;Position options;Skin to skin  LATCH Score: 7  Lactation Tools Discussed/Used Tools: Shells;Nipple Dorris CarnesShields;Pump Nipple shield size: 20 Shell Type: Inverted Breast pump type: Double-Electric Breast Pump Pump Review: Setup, frequency, and cleaning;Milk Storage Initiated by:: Peri Jeffersonl. [name] RN IBCLC Date initiated:: 01/13/17   Consult Status Consult Status: Follow-up Date: 01/13/17 Follow-up type: In-patient    [name], Diamond NickelLAURA G 01/13/2017, 3:00 AM

## 2017-01-14 MED ORDER — IBUPROFEN 600 MG PO TABS: 600.0000 mg | ORAL_TABLET | Freq: Four times a day (QID) | ORAL | 0 refills | Status: AC

## 2017-01-14 MED ORDER — IBUPROFEN 600 MG PO TABS: 600.0000 mg | ORAL_TABLET | Freq: Four times a day (QID) | ORAL | 0 refills | Status: DC

## 2017-01-14 NOTE — Lactation Note (Signed)
This note was copied from a baby's chart. Lactation Consultation Note  Patient Name: Christina Leon ZOXWR'UToday's Date: 01/14/2017 Reason for consult: Follow-up assessment  Day of discharge, baby 5749 hrs old.  Baby at 7% weight loss, output 2 stools (soft meconium) and 2 voids.  Baby BF >10 times last 24 hrs.  Using nipple shield on right side, as baby still having difficulty latching deeply.   Baby in football hold on left side when LC walked in.  Baby latched deeply onto breast.  Mom leaning into baby, and baby's body not supported high enough.  Mom reclined a bit, and baby able to attain a deep latch.  Mom encouraged to support her breast during feedings, and demonstrated alternate breast compression to increase milk transfer.  Multiple swallows identified.   Reviewed basics, and encouraged STS and cue based feedings to continue.  Mom to pump on right side if baby unable to latch. Engorgement prevention and treatment discussed.   OP lactation services discussed. Encouraged to call prn for assistance. Consult Status Consult Status: Complete Date: 01/14/17 Follow-up type: Call as needed    Judee ClaraSmith, Glendi Mohiuddin E 01/14/2017, 9:33 AM

## 2017-01-14 NOTE — Progress Notes (Signed)
PPD 2 SVD  S:  Reports feeling well- ready to go home              Tolerating po/ No nausea or vomiting             Bleeding is spotting- currently wearing panty liner              Pain controlled with ibuprofen (OTC)             Up ad lib / ambulatory / voiding QS  Newborn breast feeding- lactation consulted multiple times, use of nipple shield on right breast; patient states she had same issue with older child   O:               VS: BP 116/72 (BP Location: Right Arm)   Pulse 75   Temp 97.7 F (36.5 C) (Oral)   Resp 18   Ht 5\' 7"  (1.702 m)   Wt 81.2 kg (179 lb)   LMP 04/02/2016   SpO2 97%   Breastfeeding? Unknown   BMI 28.04 kg/m    LABS:              Recent Labs  01/12/17 0630 01/13/17 0529  WBC 9.4 11.3*  HGB 13.3 11.1*  PLT 247 253               Blood type: --/--/O POS (05/29 0630)  Rubella: Immune (10/25 0000)                             Physical Exam:             Alert and oriented X3  Abdomen: soft, non-tender, non-distended              Fundus: firm, non-tender, U/2  Perineum: 1st degree perineal lacerations and bilateral sulcus lacerations repaired, healing well with no signs of edema present   Lochia: spotting   Extremities: no edema, no calf pain or tenderness    A: PPD # 2   Doing well - stable status  P: DC home today   Follow up in office 6 week PP   [facility] discharge instructions reviewed and packet given   Steward DroneVeronica Auburn Hert BSN, SNM 01/14/2017, 11:47 AM

## 2017-01-14 NOTE — Discharge Summary (Signed)
Obstetric Discharge Summary Reason for Admission: onset of labor and rupture of membranes Prenatal Procedures: NST and ultrasound Intrapartum Procedures: spontaneous vaginal delivery Postpartum Procedures: P.P. tubal ligation Complications-Operative and Postpartum: 1st  degree perineal laceration and bilateral sulcus laceration Hemoglobin  Date Value Ref Range Status  01/13/2017 11.1 (L) 12.0 - 15.0 g/dL Final   HCT  Date Value Ref Range Status  01/13/2017 32.7 (L) 36.0 - 46.0 % Final    Physical Exam:  General: alert, cooperative and no distress Lochia: appropriate Uterine Fundus: firm Incision: healing well, no significant drainage, no dehiscence, no significant erythema DVT Evaluation: No evidence of DVT seen on physical exam. No cords or calf tenderness. No significant calf/ankle edema.  Discharge Diagnoses: Term Pregnancy-delivered and tubal ligation   Discharge Information: Date: 01/14/2017 Activity: unrestricted Diet: routine Medications: PNV, Ibuprofen and folic acid Condition: stable Instructions: refer to practice specific booklet Discharge to: home   Newborn Data: Live born female  Birth Weight: 7 lb 11.3 oz (3496 g) APGAR: 9, 9  Home with mother.  Steward DroneVeronica Kenidi Elenbaas BSN, SNM 01/14/2017, 11:39 AM

## 2017-02-26 NOTE — Addendum Note (Signed)
Addendum  created 02/26/17 1125 by Lewie LoronGermeroth, Geordie Nooney, MD   Sign clinical note

## 2018-05-27 IMAGING — US US OB COMP LESS 14 WK
1 series · 15 of 28 positions shown · non-contrast
Comparison: None.

CLINICAL DATA: Spotting and cramping today

EXAM:
OBSTETRIC <14 WK ULTRASOUND
TECHNIQUE: Transabdominal ultrasound was performed for evaluation of the
gestation as well as the maternal uterus and adnexal regions.

[Series 1: us ob comp less 14 wk · 35 acquisitions, 15 frames shown]
[im 1/35]
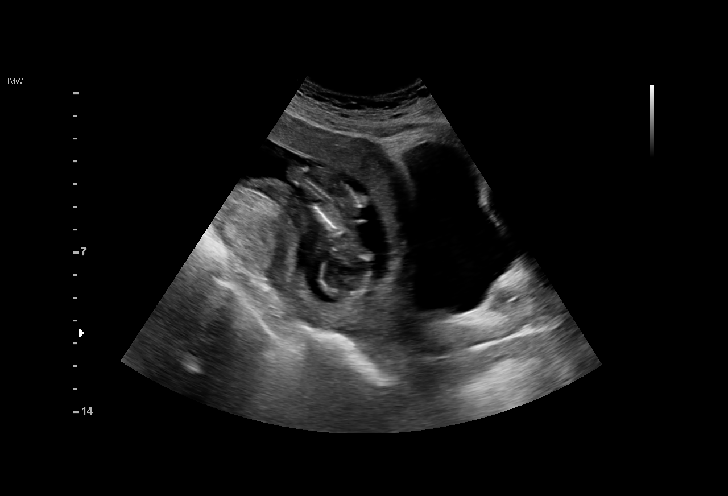
[im 3/35]
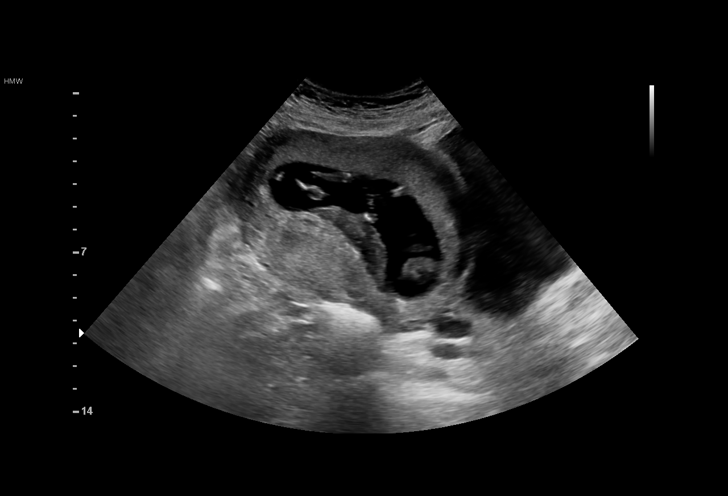
[im 6/35]
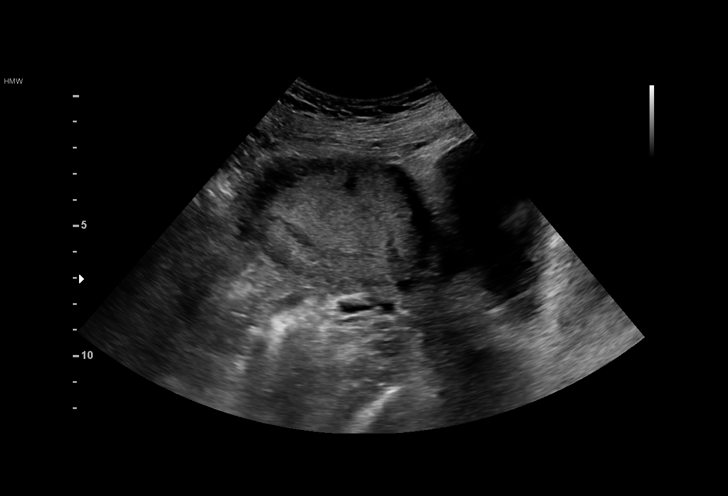
[im 8/35]
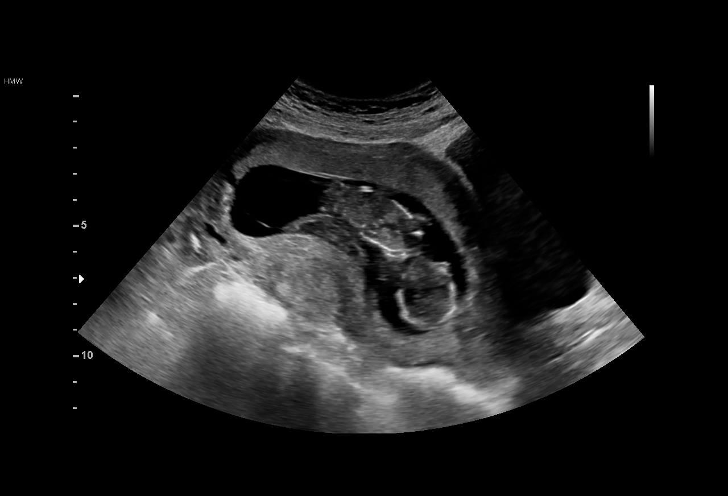
[im 11/35]
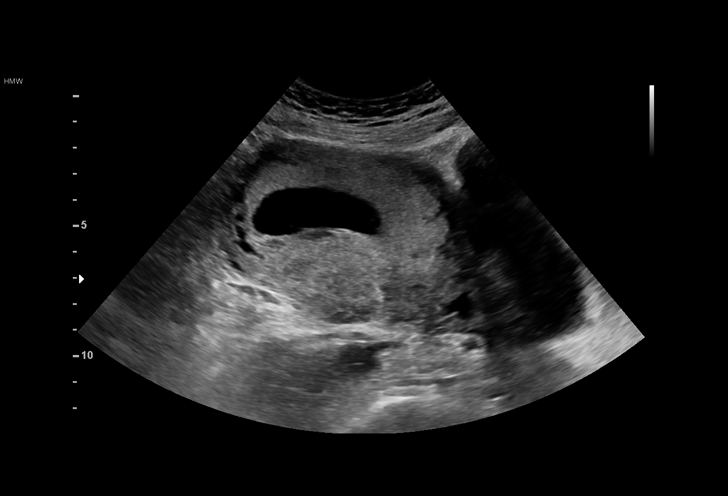
[im 13/35]
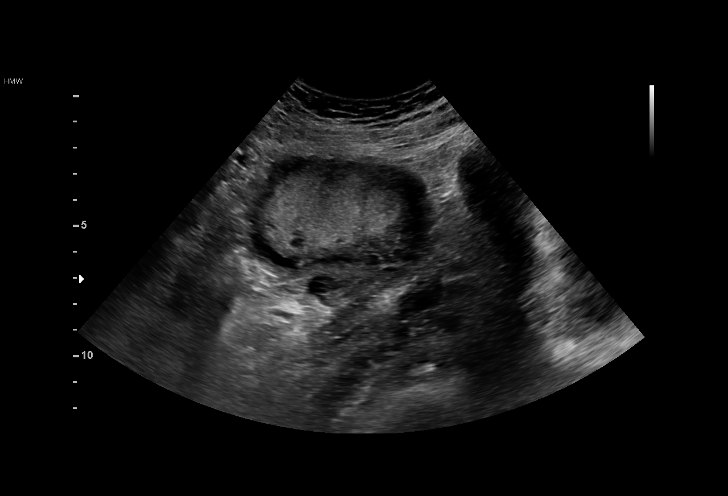
[im 16/35]
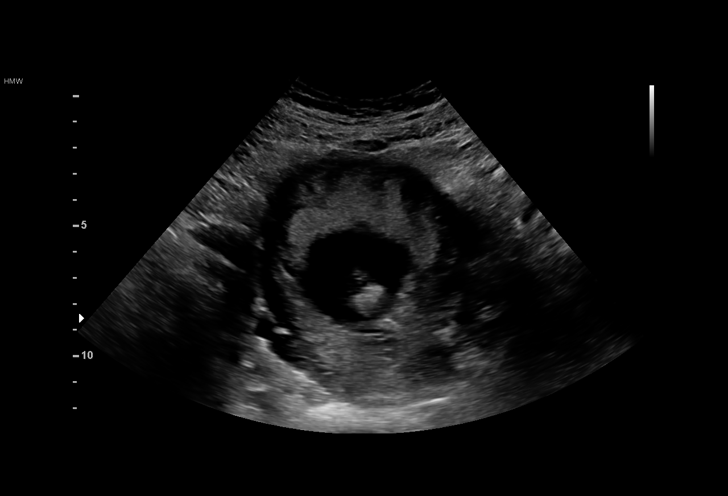
[im 18/35]
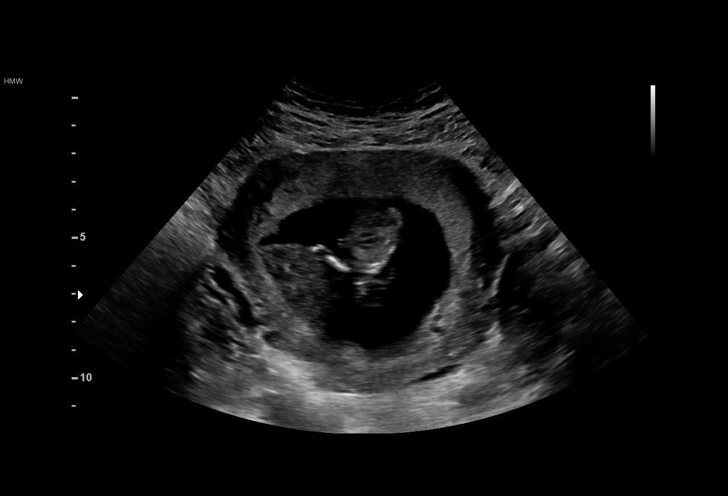
[im 19/35]
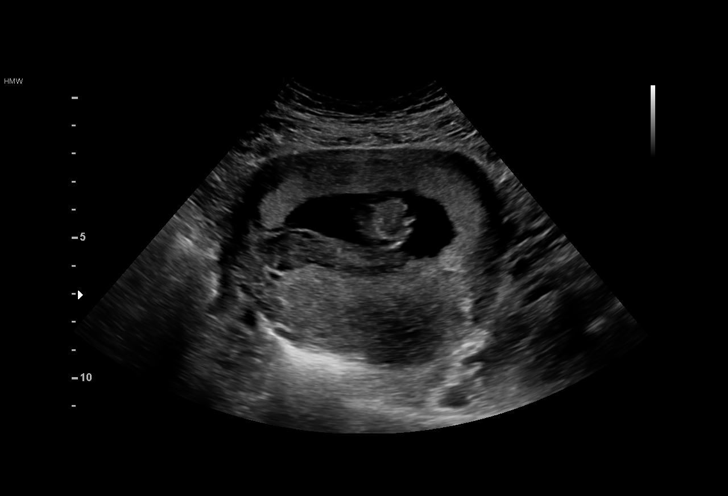
[im 22/35]
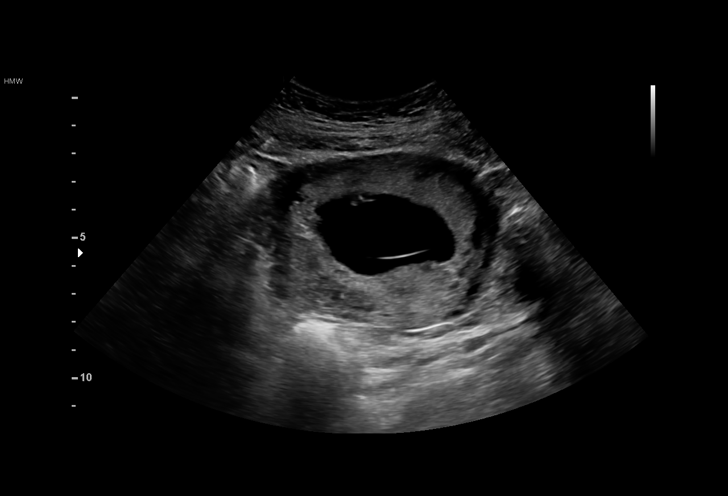
[im 24/35]
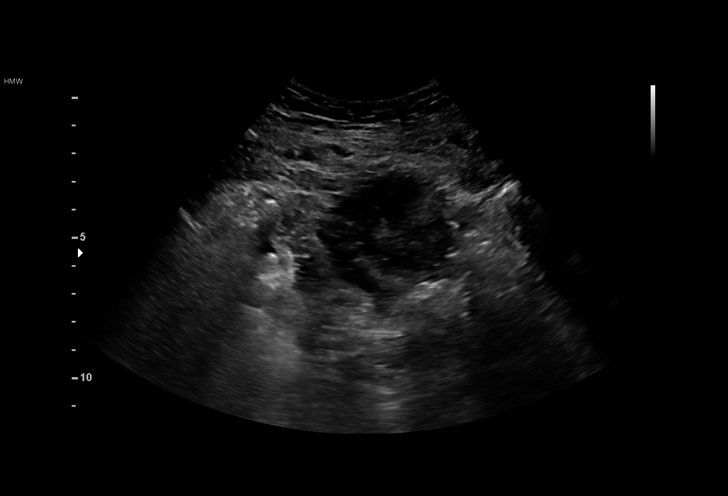
[im 27/35]
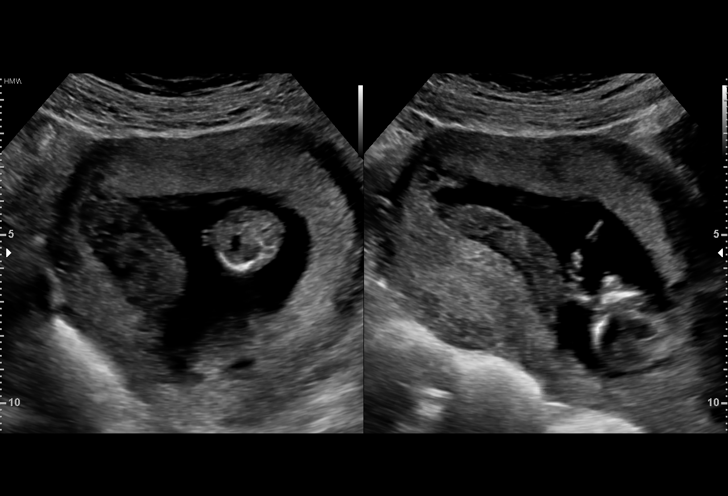
[im 29/35]
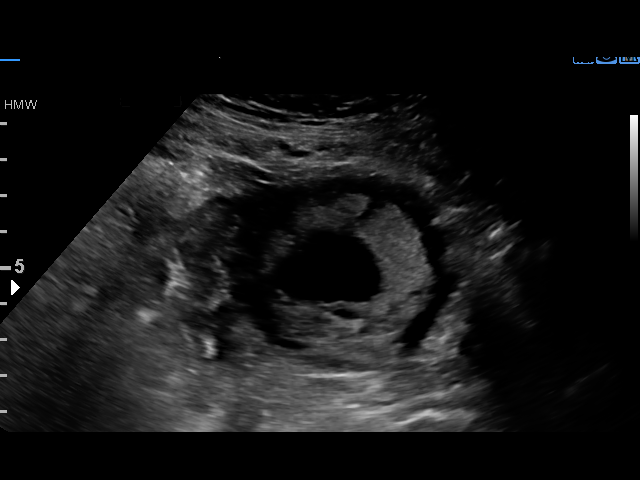
[im 32/35]
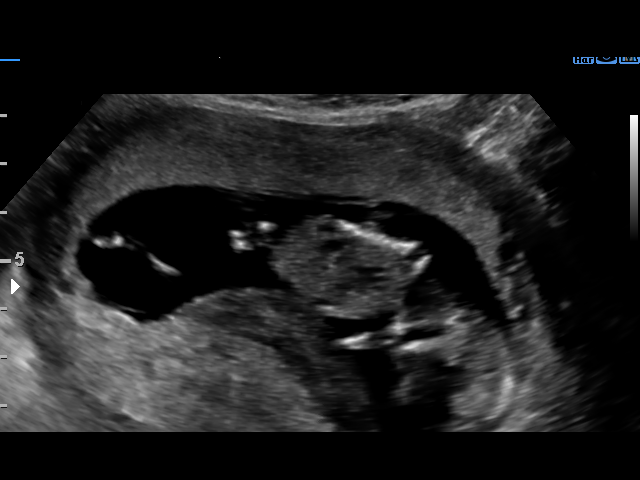
[im 35/35]
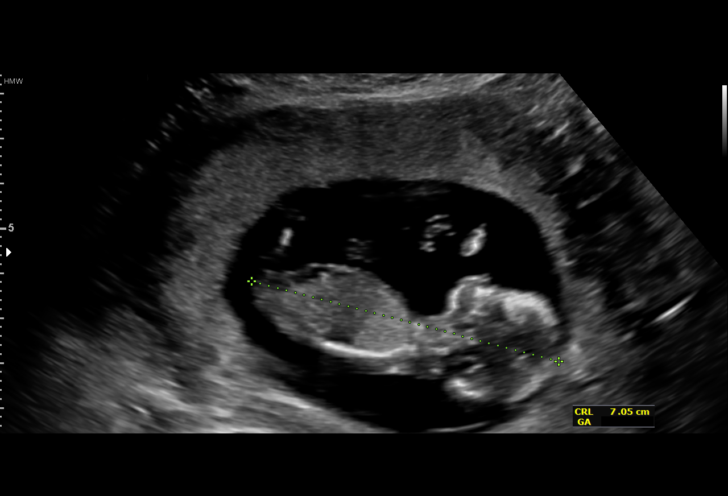

[15 of 28 positions shown; findings below may reference images not displayed]

FINDINGS: Intrauterine gestational sac: Single

Yolk sac:  Not visible

Embryo:  Visible

Cardiac Activity: Visible

Heart Rate: 177 bpm

MSD:   mm    w     d

CRL:   68.6  mm   13 w 1 d                  US EDC: 01/08/2017

Subchorionic hemorrhage: Subchorionic hemorrhage, measuring 1.2 x
2.6 x 4.0 cm.

Maternal uterus/adnexae: Neither ovary is visible. No abnormal
pelvic fluid collections.
IMPRESSION: Single living intrauterine gestation measuring 13 weeks 1 day by
crown-rump length. Subchorionic hemorrhage noted.
